# Patient Record
Sex: Female | Born: 2000 | Race: Black or African American | Hispanic: No | Marital: Single | Smoking: Never smoker
Health system: Southern US, Community
[De-identification: ages and names within clinical notes are randomized; demographics above are authoritative.]

---
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
## 2020-04-16 ENCOUNTER — Ambulatory Visit (INDEPENDENT_AMBULATORY_CARE_PROVIDER_SITE_OTHER): Payer: Medicaid Other

## 2020-04-16 ENCOUNTER — Other Ambulatory Visit: Payer: Self-pay

## 2020-04-16 ENCOUNTER — Ambulatory Visit (HOSPITAL_COMMUNITY)
Admission: EM | Admit: 2020-04-16 | Discharge: 2020-04-16 | Disposition: A | Discharge status: Home / Self Care | Payer: Medicaid Other | Attending: Family Medicine | Admitting: Family Medicine

## 2020-04-16 ENCOUNTER — Encounter (HOSPITAL_COMMUNITY): Payer: Self-pay

## 2020-04-16 DIAGNOSIS — W19XXXA Unspecified fall, initial encounter: Secondary | ICD-10-CM | POA: Diagnosis not present

## 2020-04-16 DIAGNOSIS — M25561 Pain in right knee: Secondary | ICD-10-CM

## 2020-04-16 DIAGNOSIS — M79671 Pain in right foot: Secondary | ICD-10-CM

## 2020-04-16 MED ORDER — IBUPROFEN 600 MG PO TABS: 600.0000 mg | ORAL_TABLET | Freq: Four times a day (QID) | ORAL | 0 refills | Status: DC | PRN

## 2020-04-16 NOTE — Discharge Instructions (Addendum)
Take ibuprofen for pain Limit activity for a few days Return as needed

## 2020-04-16 NOTE — ED Provider Notes (Signed)
[facility]    CSN: [id number] Arrival date & time: 04/16/20  0962      History   Chief Complaint Chief Complaint  Patient presents with  . Knee Pain  . Toe Pain    HPI Debra Higgins is a 19 y.o. female.   HPI  [place] forward landed on  Knees Right knee and right great tow are painful Pain worse with weight bearing States that she cannot comfortably bear weight, but is ambulating with minimal limp In good health and on no regular medications   No past medical history on file.  Patient Active Problem List   Diagnosis Date Noted  . 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 Abrasion of left ear canal 09/06/2014  . Abnormal uterine bleeding 03/01/2013    No past surgical history on file.  OB History   No obstetric history on file.      Home Medications    Prior to Admission medications   Medication Sig Start Date End Date Taking? Authorizing Provider  ibuprofen (ADVIL) 600 MG tablet Take 1 tablet (600 mg total) by mouth every 6 (six) hours as needed. 04/16/20   Raylene Everts, MD  neomycin-polymyxin-hydrocortisone (CORTISPORIN) 3.5-10000-1 OTIC suspension Place 4 drops into the right ear 2 times daily at 12 noon and 4 pm. 09/06/18   Robyn Haber, MD    Family History No family history on file.  Social History Social History   Tobacco Use  . Smoking status: Never Smoker  . Smokeless tobacco: Never Used  Substance Use Topics  . Alcohol use: Never  . Drug use: Yes    Frequency: 2.0 times per week    Types: Marijuana     Allergies   Patient has no known allergies.   Review of Systems Review of Systems  Musculoskeletal: Positive for arthralgias and gait problem.     Physical Exam Triage Vital Signs ED Triage Vitals  Enc Vitals Group     BP 04/16/20 1021 121/60     Pulse Rate 04/16/20 1021 68     Resp 04/16/20 1021 14     Temp 04/16/20 1021 98.4 F (36.9 C)     Temp Source 04/16/20  1021 Oral     SpO2 04/16/20 1021 100 %     Weight --      Height --      Head Circumference --      Peak Flow --      Pain Score 04/16/20 1018 2     Pain Loc --      Pain Edu? --      Excl. in [place]? --    No data found.  Updated Vital Signs BP 121/60 (BP Location: Right Arm)   Pulse 68   Temp 98.4 F (36.9 C) (Oral)   Resp 14   LMP 04/12/2020 (Exact Date)   SpO2 100%     Physical Exam Constitutional:      General: She is not in acute distress.    Appearance: She is well-developed and normal weight.  HENT:     Head: Normocephalic and atraumatic.     Mouth/Throat:     Comments: Mask is in place Eyes:     Conjunctiva/sclera: Conjunctivae normal.     Pupils: Pupils are equal, round, and reactive to light.  Cardiovascular:     Rate and Rhythm: Normal rate.  Pulmonary:     Effort: Pulmonary effort is normal. No respiratory  distress.  Musculoskeletal:        General: Normal range of motion.     Cervical back: Normal range of motion.     Comments: Right knee has some soft tissue swelling and tenderness over the tibial tuberosity.  No joint line tenderness.  No instability.  Right great toe has ecchymosis and swelling.  Tenderness over the middle phalanx and MCP.  Skin:    General: Skin is warm and dry.  Neurological:     Mental Status: She is alert.  Psychiatric:        Mood and Affect: Mood normal.        Behavior: Behavior normal.      UC Treatments / Results  Labs (all labs ordered are listed, but only abnormal results are displayed) Labs Reviewed - No data to display  EKG   Radiology DG Knee Complete 4 Views Right  Result Date: 04/16/2020 CLINICAL DATA:  Acute right knee pain after fall 4 days ago. EXAM: RIGHT KNEE - COMPLETE 4+ VIEW COMPARISON:  None. FINDINGS: No evidence of fracture, dislocation, or joint effusion. No evidence of arthropathy or other focal bone abnormality. Soft tissues are unremarkable. IMPRESSION: Negative. Electronically Signed   By:  Lupita Raider M.D.   On: 04/16/2020 12:08   DG Toe Great Right  Result Date: 04/16/2020 CLINICAL DATA:  Right great toe pain after fall 4 days ago. EXAM: RIGHT GREAT TOE COMPARISON:  None. FINDINGS: There is no evidence of fracture or dislocation. There is no evidence of arthropathy or other focal bone abnormality. Soft tissues are unremarkable. IMPRESSION: Negative. Electronically Signed   By: Lupita Raider M.D.   On: 04/16/2020 12:09    Procedures Procedures (including critical care time)  Medications Ordered in UC Medications - No data to display  Initial Impression / Assessment and Plan / UC Course  I have reviewed the triage vital signs and the nursing notes.  Pertinent labs & imaging results that were available during my care of the patient were reviewed by me and considered in my medical decision making (see chart for details).     Gust negative x-rays.  Conservative management.  Return as needed Final Clinical Impressions(s) / UC Diagnoses   Final diagnoses:  Acute pain of right knee  Fall, initial encounter     Discharge Instructions     Take ibuprofen for pain Limit activity for a few days Return as needed   ED Prescriptions    Medication Sig Dispense Auth. Provider   ibuprofen (ADVIL) 600 MG tablet Take 1 tablet (600 mg total) by mouth every 6 (six) hours as needed. 30 tablet Eustace Moore, MD     PDMP not reviewed this encounter.   Eustace Moore, MD 04/16/20 1251

## 2020-04-16 NOTE — ED Triage Notes (Signed)
C/o right knee pain and right big toe pain after fall on Saturday.

## 2020-10-29 ENCOUNTER — Ambulatory Visit: Payer: BC Managed Care – PPO | Admitting: Family Medicine

## 2020-11-05 NOTE — Progress Notes (Signed)
   Subjective:    Patient ID: Debra Higgins, female    DOB: 06-30-2001, 19 y.o.   MRN: [id number]  CC: Establish care  HPI:  Debra Higgins is a very pleasant 19 y.o. female who presents today to establish care.  Initial concerns:  Inverted nipples Notice when she was young. Is cosmetically dissatisfying. Denies breast pain, nipple drainage, or skin changes.   Passing out Occurred 3 times in the last month. First time was after smoking marijuana, second was when her uncle dropped an object on her foot, and third time was in the hairdresser's chair. Each time she gets really hot and nauseous. Unaware of how long she is "out". Has never occurred before and feels fine after she cools off.   Heavy periods Has had heavy periods since she started menstruating. Her periods are regular every month for a total of 6 days. She endorses that she changes her pads/tampons every hour. Concern for anemia   Past medical history: As above.  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 Smokes marijuana on the weekends. Same sex partner. Sexually active, does not use any protection.   ROS: pertinent noted in the HPI   Objective:  BP 102/72   Pulse 94   Ht 5\' 6"  (1.676 m)   Wt 116 lb 9.6 oz (52.9 kg)   LMP 10/23/2020   SpO2 99%   BMI 18.82 kg/m   Vitals and nursing note reviewed  General: NAD, pleasant, able to participate in exam Cardiac: RRR, S1 S2 present. normal heart sounds, no murmurs. Respiratory: CTAB, normal effort, No wheezes, rales or rhonchi Abdomen: Bowel sounds present, non-tender, non-distended, no hepatosplenomegaly Extremities: no edema or cyanosis. Skin: warm and dry, no rashes noted Neuro: alert, no obvious focal deficits Psych: Normal affect and mood  Assessment & Plan:   Heat syncope Acute over 1 month, occurring 3 times all after feelings of over heating.  Cardiac sounds unremarkable today and otherwise healthy without a history of passing out. Known marijuana smoker and incident surrounding this as well. Likely heat induced due to dilatation of blood vessels and lowering of Bp at the time of heat dysregulation. Discussed symptom diary, dressing in layer to easily take clothing off or going outside in cold air to cool off before feelings of passing out. If continues to occur consider cardiac work up such as starting with EKG, consider orthostatic BPs.   Menorrhagia with regular cycle Concern for anemia due to heavy bleeding endorse by changing pad/tampon every hour during cycle.  -f/u CBC w/ diff; consider iron supplementation if needed  Inversion of nipple Chronic. Will revisit if patient decides to breastfeed otherwise more of a cosmetic issue.   Routine screening for STI (sexually transmitted infection) Declined vaginal swab for GC/Ch & Wet mount - HCV Ab w Reflex to Quant PCR - HIV antibody (with reflex) - RPR  Need for vaccination - Meningococcal MCV4O(Menveo) - HPV 9-valent vaccine,Recombinat - Flu Vaccine QUAD 36+ mos IM  10/25/2020, DO [facility] PGY-2

## 2020-11-06 ENCOUNTER — Encounter: Payer: Self-pay | Admitting: Family Medicine

## 2020-11-06 ENCOUNTER — Ambulatory Visit (INDEPENDENT_AMBULATORY_CARE_PROVIDER_SITE_OTHER): Payer: 59 | Admitting: Family Medicine

## 2020-11-06 ENCOUNTER — Other Ambulatory Visit: Payer: Self-pay

## 2020-11-06 VITALS — BP 102/72 | HR 94 | Ht 66.0 in | Wt 116.6 lb

## 2020-11-06 DIAGNOSIS — N920 Excessive and frequent menstruation with regular cycle: Secondary | ICD-10-CM

## 2020-11-06 DIAGNOSIS — Z23 Encounter for immunization: Secondary | ICD-10-CM

## 2020-11-06 DIAGNOSIS — N6459 Other signs and symptoms in breast: Secondary | ICD-10-CM | POA: Diagnosis not present

## 2020-11-06 DIAGNOSIS — Z113 Encounter for screening for infections with a predominantly sexual mode of transmission: Secondary | ICD-10-CM | POA: Diagnosis not present

## 2020-11-06 DIAGNOSIS — T671XXA Heat syncope, initial encounter: Secondary | ICD-10-CM

## 2020-11-06 NOTE — Assessment & Plan Note (Signed)
Chronic. Will revisit if patient decides to breastfeed otherwise more of a cosmetic issue.

## 2020-11-06 NOTE — Assessment & Plan Note (Signed)
Acute over 1 month, occurring 3 times all after feelings of over heating. Cardiac sounds unremarkable today and otherwise healthy without a history of passing out. Known marijuana smoker and incident surrounding this as well. Likely heat induced due to dilatation of blood vessels and lowering of Bp at the time of heat dysregulation. Discussed symptom diary, dressing in layer to easily take clothing off or going outside in cold air to cool off before feelings of passing out. If continues to occur consider cardiac work up such as starting with EKG, consider orthostatic BPs.

## 2020-11-06 NOTE — Assessment & Plan Note (Signed)
Concern for anemia due to heavy bleeding endorse by changing pad/tampon every hour during cycle.  -f/u CBC w/ diff; consider iron supplementation if needed

## 2020-11-06 NOTE — Patient Instructions (Signed)
It was wonderful to see you today.  Today we talked about:  Inverted nipples which are more cosmetic and would only become an issue if you decided to breast-feed.  This can be revisited in the future.  Episodes of passing out when getting hot.  Please stay hydrated and dressed in layers to where you can take off close during the winter if you do become too warm.  Keep a symptom diary and please follow-up if this continues to occur.  We also discussed cutting back on marijuana use.  I will call you if any lab work is abnormal otherwise I will send a letter stating your results were normal.  Please call the clinic at [phone] if your symptoms worsen or you have any concerns. It was our pleasure to serve you.  Dr. Salvadore Dom

## 2020-11-07 ENCOUNTER — Encounter: Payer: Self-pay | Admitting: Family Medicine

## 2020-11-07 LAB — CBC WITH DIFFERENTIAL/PLATELET
Basophils Absolute: 0.1 10*3/uL (ref 0.0–0.2)
Basos: 1 %
EOS (ABSOLUTE): 0.1 10*3/uL (ref 0.0–0.4)
Eos: 2 %
Hematocrit: 32.2 % — ABNORMAL LOW (ref 34.0–46.6)
Hemoglobin: 11.1 g/dL (ref 11.1–15.9)
Immature Grans (Abs): 0 10*3/uL (ref 0.0–0.1)
Immature Granulocytes: 0 %
Lymphocytes Absolute: 2.2 10*3/uL (ref 0.7–3.1)
Lymphs: 37 %
MCH: 28.6 pg (ref 26.6–33.0)
MCHC: 34.5 g/dL (ref 31.5–35.7)
MCV: 83 fL (ref 79–97)
Monocytes Absolute: 0.5 10*3/uL (ref 0.1–0.9)
Monocytes: 9 %
Neutrophils Absolute: 3 10*3/uL (ref 1.4–7.0)
Neutrophils: 51 %
Platelets: 287 10*3/uL (ref 150–450)
RBC: 3.88 x10E6/uL (ref 3.77–5.28)
RDW: 14 % (ref 11.7–15.4)
WBC: 5.8 10*3/uL (ref 3.4–10.8)

## 2020-11-07 LAB — HCV INTERPRETATION

## 2020-11-07 LAB — HCV AB W REFLEX TO QUANT PCR: HCV Ab: 0.2 s/co ratio (ref 0.0–0.9)

## 2020-11-07 LAB — HIV ANTIBODY (ROUTINE TESTING W REFLEX): HIV Screen 4th Generation wRfx: NONREACTIVE

## 2020-11-07 LAB — RPR: RPR Ser Ql: NONREACTIVE

## 2020-12-06 IMAGING — DX DG TOE GREAT 2+V*R*
3 series · 3 of 3 positions shown · non-contrast
Comparison: None.

CLINICAL DATA: Right great toe pain after fall 4 days ago.

EXAM:
RIGHT GREAT TOE

[toe ap]
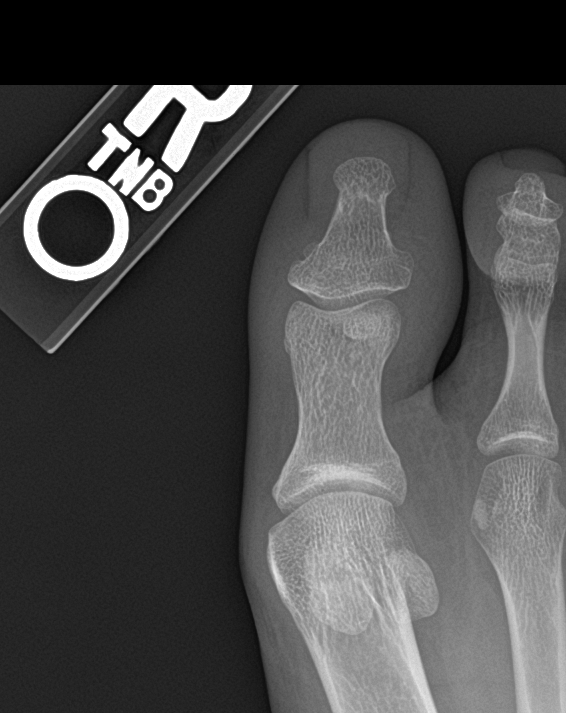

[toe obl]
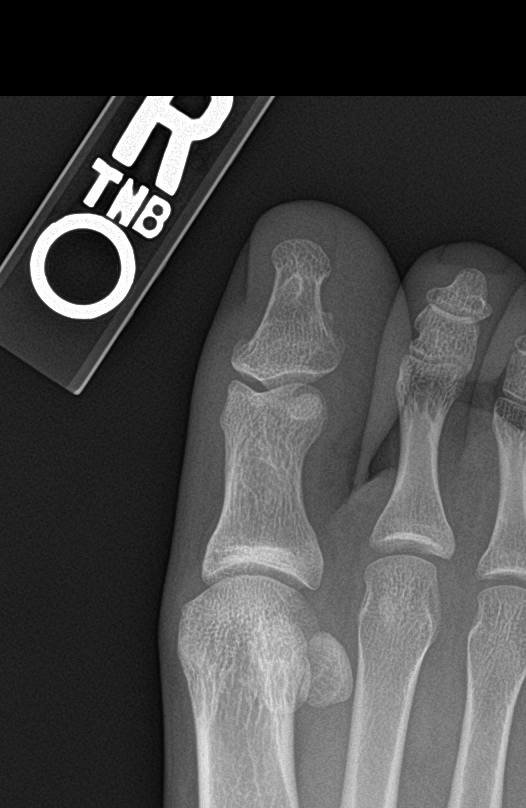

[toe lat]
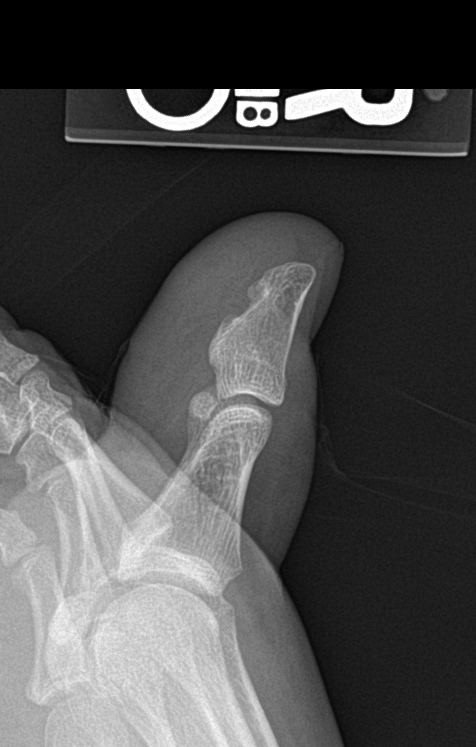

[3 of 3 positions shown; findings below may reference images not displayed]

FINDINGS: There is no evidence of fracture or dislocation. There is no
evidence of arthropathy or other focal bone abnormality. Soft
tissues are unremarkable.
IMPRESSION: Negative.

## 2022-07-31 ENCOUNTER — Ambulatory Visit (HOSPITAL_COMMUNITY)
Admission: EM | Admit: 2022-07-31 | Discharge: 2022-07-31 | Disposition: A | Discharge status: Home / Self Care | Payer: Medicaid Other | Attending: Nurse Practitioner | Admitting: Nurse Practitioner

## 2022-07-31 ENCOUNTER — Encounter (HOSPITAL_COMMUNITY): Payer: Self-pay | Admitting: Emergency Medicine

## 2022-07-31 ENCOUNTER — Ambulatory Visit (HOSPITAL_COMMUNITY): Payer: Medicaid Other

## 2022-07-31 DIAGNOSIS — U071 COVID-19: Secondary | ICD-10-CM | POA: Insufficient documentation

## 2022-07-31 DIAGNOSIS — R051 Acute cough: Secondary | ICD-10-CM

## 2022-07-31 LAB — POCT RAPID STREP A, ED / UC: Streptococcus, Group A Screen (Direct): NEGATIVE

## 2022-07-31 LAB — SARS CORONAVIRUS 2 BY RT PCR: SARS Coronavirus 2 by RT PCR: POSITIVE — AB

## 2022-07-31 LAB — POC INFLUENZA A AND B ANTIGEN (URGENT CARE ONLY)
INFLUENZA A ANTIGEN, POC: NEGATIVE
INFLUENZA B ANTIGEN, POC: NEGATIVE

## 2022-07-31 MED ORDER — BENZONATATE 100 MG PO CAPS: 100.0000 mg | ORAL_CAPSULE | Freq: Three times a day (TID) | ORAL | 0 refills | Status: AC

## 2022-07-31 NOTE — ED Provider Notes (Signed)
[facility]    CSN: [id number] Arrival date & time: 07/31/22  0847      History   Chief Complaint Chief Complaint  Patient presents with   Cough    HPI Debra Higgins is a 21 y.o. female.   HPI  She is complaining of cough and sore throat. Denies fever, head or nasal congestion, sneezing, runny nose,  ear pain or pressure, sore throat, new loss of smell or taste, shortness of breath, chest pain, nausea or diarrhea. She has not taken any OTC medication. She denies any recent exposures.   History reviewed. No pertinent past medical history.  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 No pertinent surgical history.  OB History   No obstetric history on file.      Home Medications    Prior to Admission medications   Medication Sig Start Date End Date Taking? Authorizing Provider  benzonatate (TESSALON) 100 MG capsule Take 1 capsule (100 mg total) by mouth every 8 (eight) hours. 07/31/22  Yes Barbette Merino, NP  ibuprofen (ADVIL) 600 MG tablet Take 1 tablet (600 mg total) by mouth every 6 (six) hours as needed. 04/16/20   Eustace Moore, MD  neomycin-polymyxin-hydrocortisone (CORTISPORIN) 3.5-10000-1 OTIC suspension Place 4 drops into the right ear 2 times daily at 12 noon and 4 pm. 09/06/18   Elvina Sidle, MD    Family History Family History  Problem Relation Age of Onset   Diabetes Father    Diabetes Maternal Grandmother    Diabetes Other    Cervical cancer Other    Breast cancer Other     Social History Social History   Tobacco Use   Smoking status: Never   Smokeless tobacco: Never  Substance Use Topics   Alcohol use: Never   Drug use: Yes    Frequency: 2.0 times per week    Types: Marijuana     Allergies   Patient has no known allergies.   Review of  Systems Review of Systems   Physical Exam Triage Vital Signs ED Triage Vitals  Enc Vitals Group     BP 07/31/22 0858 102/69     Pulse Rate 07/31/22 0858 85     Resp 07/31/22 0858 18     Temp 07/31/22 0858 98.9 F (37.2 C)     Temp Source 07/31/22 0858 Oral     SpO2 07/31/22 0858 98 %     Weight --      Height --      Head Circumference --      Peak Flow --      Pain Score 07/31/22 0856 0     Pain Loc --      Pain Edu? --      Excl. in GC? --    No data found.  Updated Vital Signs BP 102/69 (BP Location: Right Arm)   Pulse 85   Temp 98.9 F (37.2 C) (Oral)   Resp 18   SpO2 98%   Visual Acuity Right Eye Distance:   Left Eye Distance:   Bilateral Distance:    Right Eye Near:   Left Eye Near:    Bilateral Near:     Physical Exam Constitutional:      General: She is not in acute distress.  Appearance: She is normal weight.  HENT:     Head: Normocephalic and atraumatic.     Nose: Nose normal.     Mouth/Throat:     Mouth: Mucous membranes are moist.  Eyes:     Extraocular Movements: Extraocular movements intact.     Pupils: Pupils are equal, round, and reactive to light.  Cardiovascular:     Rate and Rhythm: Normal rate and regular rhythm.     Pulses: Normal pulses.  Pulmonary:     Effort: Pulmonary effort is normal.     Breath sounds: Normal breath sounds. No wheezing, rhonchi or rales.  Musculoskeletal:        General: Normal range of motion.     Cervical back: Normal range of motion.  Skin:    General: Skin is warm and dry.     Capillary Refill: Capillary refill takes less than 2 seconds.  Neurological:     General: No focal deficit present.     Mental Status: She is alert and oriented to person, place, and time.  Psychiatric:        Mood and Affect: Mood normal.        Behavior: Behavior normal.      UC Treatments / Results  Labs (all labs ordered are listed, but only abnormal results are displayed) Labs Reviewed  SARS CORONAVIRUS 2 BY  RT PCR  POCT RAPID STREP A, ED / UC  POC INFLUENZA A AND B ANTIGEN (URGENT CARE ONLY)    EKG   Radiology No results found.  Procedures Procedures (including critical care time)  Medications Ordered in UC Medications - No data to display  Initial Impression / Assessment and Plan / UC Course  I have reviewed the triage vital signs and the nursing notes.  Pertinent labs & imaging results that were available during my care of the patient were reviewed by me and considered in my medical decision making (see chart for details).     Cough COVID test pending  Final Clinical Impressions(s) / UC Diagnoses   Final diagnoses:  Acute cough     Discharge Instructions      Your COVID test in pending Influenza and Strep Test are negative Your cough can be soothed with a cough suppressant. You have been prescribed Benzonatate 100 mg three times a day  We encourage conservative treatment with symptom relief. We encourage you to use Tylenol alternating with Ibuprofen for your fever if not contraindicated. (Remember to use as directed do not exceed daily dosing recommendations) We also encourage salt water gargles for your sore throat. You should also consider throat lozenges and chloraseptic spray.         ED Prescriptions     Medication Sig Dispense Auth. Provider   benzonatate (TESSALON) 100 MG capsule Take 1 capsule (100 mg total) by mouth every 8 (eight) hours. 21 capsule Barbette Merino, NP      PDMP not reviewed this encounter.   Thad Ranger [place], [place] 07/31/22 [phone]

## 2022-07-31 NOTE — Discharge Instructions (Addendum)
Your COVID test in pending Influenza and Strep Test are negative Your cough can be soothed with a cough suppressant. You have been prescribed Benzonatate 100 mg three times a day  We encourage conservative treatment with symptom relief. We encourage you to use Tylenol alternating with Ibuprofen for your fever if not contraindicated. (Remember to use as directed do not exceed daily dosing recommendations) We also encourage salt water gargles for your sore throat. You should also consider throat lozenges and chloraseptic spray.

## 2022-07-31 NOTE — ED Triage Notes (Signed)
Pt reports a cough x 4/5 days. States she feels fine, however due to living with older people her aunt advised her to come get a covid test.

## 2022-08-04 ENCOUNTER — Emergency Department (HOSPITAL_BASED_OUTPATIENT_CLINIC_OR_DEPARTMENT_OTHER)
Admission: EM | Admit: 2022-08-04 | Discharge: 2022-08-04 | Disposition: A | Discharge status: Home / Self Care | Payer: Medicaid Other | Attending: Emergency Medicine | Admitting: Emergency Medicine

## 2022-08-04 ENCOUNTER — Other Ambulatory Visit: Payer: Self-pay

## 2022-08-04 ENCOUNTER — Encounter (HOSPITAL_BASED_OUTPATIENT_CLINIC_OR_DEPARTMENT_OTHER): Payer: Self-pay

## 2022-08-04 DIAGNOSIS — J029 Acute pharyngitis, unspecified: Secondary | ICD-10-CM | POA: Diagnosis not present

## 2022-08-04 DIAGNOSIS — J069 Acute upper respiratory infection, unspecified: Secondary | ICD-10-CM | POA: Insufficient documentation

## 2022-08-04 DIAGNOSIS — Z20822 Contact with and (suspected) exposure to covid-19: Secondary | ICD-10-CM | POA: Insufficient documentation

## 2022-08-04 DIAGNOSIS — R059 Cough, unspecified: Secondary | ICD-10-CM | POA: Diagnosis present

## 2022-08-04 LAB — SARS CORONAVIRUS 2 BY RT PCR: SARS Coronavirus 2 by RT PCR: NEGATIVE

## 2022-08-04 LAB — GROUP A STREP BY PCR: Group A Strep by PCR: NOT DETECTED

## 2022-08-04 NOTE — Discharge Instructions (Signed)
Recommend you follow-up in this department if your symptoms worsen over the next few days.  You likely have a viral upper respiratory infection which should run its course over the next several days.  If you develop worsening severe one-sided swelling, fever, difficulty swallowing, return to the emergency department for likely CT imaging as this could reflect a developing peritonsillar abscess.

## 2022-08-04 NOTE — ED Triage Notes (Signed)
Pt states that she has had a cough for the past week and sore throat for the past few days, denies fevers.

## 2022-08-04 NOTE — ED Provider Notes (Signed)
[facility] EMERGENCY DEPT Provider Note   CSN: [id number] Arrival date & time: 08/04/22  5053     History  Chief Complaint  Patient presents with   Sore Throat    Debra Higgins is a 21 y.o. female.   Sore Throat     21 year old female presenting to the emergency department with a cough for the past week and a sore throat for the past 12 hours.  The patient states that her symptoms of sore throat came on last night at midnight.  Debra Higgins has had a mild nonproductive cough for the past week.  Debra Higgins denies any sick contacts.  Debra Higgins denies any fevers or chills.  Debra Higgins endorses no difficulty swallowing or ranging her neck.  Debra Higgins endorses pain while swallowing.  No abdominal pain, nausea, vomiting.  Home Medications Prior to Admission medications   Medication Sig Start Date End Date Taking? Authorizing Provider  benzonatate (TESSALON) 100 MG capsule Take 1 capsule (100 mg total) by mouth every 8 (eight) hours. 07/31/22   Barbette Merino, NP  ibuprofen (ADVIL) 600 MG tablet Take 1 tablet (600 mg total) by mouth every 6 (six) hours as needed. 04/16/20   Eustace Moore, MD  neomycin-polymyxin-hydrocortisone (CORTISPORIN) 3.5-10000-1 OTIC suspension Place 4 drops into the right ear 2 times daily at 12 noon and 4 pm. 09/06/18   Elvina Sidle, MD      Allergies    Patient has no known allergies.    Review of Systems   Review of Systems  HENT:  Positive for sore throat.   All other systems reviewed and are negative.   Physical Exam Updated Vital Signs BP 107/72 (BP Location: Right Arm)   Pulse 79   Temp 98.2 F (36.8 C)   Resp 16   SpO2 100%  Physical Exam Vitals and nursing note reviewed.  Constitutional:      General: Debra Higgins is not in acute distress.    Appearance: Debra Higgins is well-developed.  HENT:     Head: Normocephalic and atraumatic.     Mouth/Throat:     Pharynx: Posterior oropharyngeal erythema present.     Tonsils: No tonsillar exudate or tonsillar abscesses. 0  on the right. 0 on the left.  Eyes:     Conjunctiva/sclera: Conjunctivae normal.  Cardiovascular:     Rate and Rhythm: Normal rate and regular rhythm.     Heart sounds: No murmur heard. Pulmonary:     Effort: Pulmonary effort is normal. No respiratory distress.     Breath sounds: Normal breath sounds.  Abdominal:     Palpations: Abdomen is soft.     Tenderness: There is no abdominal tenderness.  Musculoskeletal:        General: No swelling.     Cervical back: Neck supple.  Skin:    General: Skin is warm and dry.     Capillary Refill: Capillary refill takes less than 2 seconds.  Neurological:     Mental Status: Debra Higgins is alert.  Psychiatric:        Mood and Affect: Mood normal.     ED Results / Procedures / Treatments   Labs (all labs ordered are listed, but only abnormal results are displayed) Labs Reviewed  GROUP A STREP BY PCR  SARS CORONAVIRUS 2 BY RT PCR    EKG None  Radiology No results found.  Procedures Procedures    Medications Ordered in ED Medications - No data to display  ED Course/ Medical Decision Making/ A&P  Medical Decision Making    21 year old female presenting to the emergency department with a cough for the past week and a sore throat for the past 12 hours.  The patient states that her symptoms of sore throat came on last night at midnight.  Debra Higgins has had a mild nonproductive cough for the past week.  Debra Higgins denies any sick contacts.  Debra Higgins denies any fevers or chills.  Debra Higgins endorses no difficulty swallowing or ranging her neck.  Debra Higgins endorses pain while swallowing.  No abdominal pain, nausea, vomiting.   On arrival, the patient was vitally stable.  On my exam, the patient is well-appearing and well-hydrated.  The patient's lungs are clear to auscultation bilaterally. Additionally, the patient has a soft/non-tender abdomen, clear tympanic membranes, and no oropharyngeal exudates.  There are no signs of meningismus.  I see no  signs of an acute bacterial infection.  The patient's presentation is most consistent with a viral upper respiratory infection.  I have a low suspicion for pneumonia as the patient's cough has been non-productive and the patient is neither tachypneic nor hypoxic on room air.  Additionally, the patient is CTAB.  COVID-19 PCR testing was collected and resulted negative.  Strep PCR testing also resulted negative.  Symptoms are consistent with likely viral URI.  No evidence of peritonsillar abscess on exam.  Range of motion of the neck.  No asymmetric swelling noted on exam.  No evidence for Ludwig's or RPA.  I discussed symptomatic management, including hydration, motrin, and tylenol. The patient felt safe being discharged from the ED.  They agreed to followup with the PCP if needed.  I provided ED return precautions.  Final Clinical Impression(s) / ED Diagnoses Final diagnoses:  Sore throat  Upper respiratory tract infection, unspecified type    Rx / DC Orders ED Discharge Orders     None         Ernie Avena, MD 08/04/22 [phone]

## 2022-10-23 ENCOUNTER — Ambulatory Visit (HOSPITAL_COMMUNITY)
Admission: EM | Admit: 2022-10-23 | Discharge: 2022-10-23 | Disposition: A | Discharge status: Home / Self Care | Payer: Medicaid Other

## 2022-10-23 ENCOUNTER — Encounter (HOSPITAL_COMMUNITY): Payer: Self-pay

## 2022-10-23 DIAGNOSIS — Z8669 Personal history of other diseases of the nervous system and sense organs: Secondary | ICD-10-CM | POA: Diagnosis not present

## 2022-10-23 DIAGNOSIS — H6121 Impacted cerumen, right ear: Secondary | ICD-10-CM | POA: Diagnosis not present

## 2022-10-23 NOTE — ED Provider Notes (Signed)
[facility]    CSN: [id number] Arrival date & time: 10/23/22  1251      History   Chief Complaint Chief Complaint  Patient presents with   Otalgia    HPI Debra Higgins is a 21 y.o. female.  Presents with 3-day history of right ear discomfort and muffled hearing Reports history of wax buildup that usually feels like this Gets buildup about once yearly  Denies any fevers or drainage from the ear  History reviewed. No pertinent past medical history.  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 No pertinent surgical history.  OB History   No obstetric history on file.      Home Medications    Prior to Admission medications   Medication Sig Start Date End Date Taking? Authorizing Provider  benzonatate (TESSALON) 100 MG capsule Take 1 capsule (100 mg total) by mouth every 8 (eight) hours. 07/31/22   Barbette Merino, NP  ibuprofen (ADVIL) 600 MG tablet Take 1 tablet (600 mg total) by mouth every 6 (six) hours as needed. 04/16/20   Eustace Moore, MD  neomycin-polymyxin-hydrocortisone (CORTISPORIN) 3.5-10000-1 OTIC suspension Place 4 drops into the right ear 2 times daily at 12 noon and 4 pm. 09/06/18   Elvina Sidle, MD    Family History Family History  Problem Relation Age of Onset   Diabetes Father    Diabetes Maternal Grandmother    Diabetes Other    Cervical cancer Other    Breast cancer Other     Social History Social History   Tobacco Use   Smoking status: Never   Smokeless tobacco: Never  Substance Use Topics   Alcohol use: Never   Drug use: Yes    Frequency: 2.0 times per week    Types: Marijuana     Allergies   Patient has no known allergies.   Review of Systems Review of Systems  HENT:  Positive for ear pain.    Per HPI  Physical Exam Triage  Vital Signs ED Triage Vitals  Enc Vitals Group     BP 10/23/22 1410 109/69     Pulse Rate 10/23/22 1410 67     Resp 10/23/22 1410 12     Temp 10/23/22 1410 98 F (36.7 C)     Temp Source 10/23/22 1410 Oral     SpO2 10/23/22 1410 99 %     Weight --      Height --      Head Circumference --      Peak Flow --      Pain Score 10/23/22 1408 5     Pain Loc --      Pain Edu? --      Excl. in GC? --    No data found.  Updated Vital Signs BP 109/69 (BP Location: Left Arm)   Pulse 67   Temp 98 F (36.7 C) (Oral)   Resp 12   LMP 10/13/2022   SpO2 99%     Physical Exam Vitals and nursing note reviewed.  Constitutional:      General: She is not in acute distress. HENT:     Right Ear: External ear normal.     Left Ear: Tympanic membrane and ear canal normal.     Ears:     Comments: Right  ear canal mildly impacted with wax.  Appears to be mixture of hard and soft material. Left canal is clear Cardiovascular:     Rate and Rhythm: Normal rate and regular rhythm.  Pulmonary:     Effort: Pulmonary effort is normal.  Neurological:     Mental Status: She is alert and oriented to person, place, and time.      UC Treatments / Results  Labs (all labs ordered are listed, but only abnormal results are displayed) Labs Reviewed - No data to display  EKG   Radiology No results found.  Procedures Procedures (including critical care time)  Medications Ordered in UC Medications - No data to display  Initial Impression / Assessment and Plan / UC Course  I have reviewed the triage vital signs and the nursing notes.  Pertinent labs & imaging results that were available during my care of the patient were reviewed by me and considered in my medical decision making (see chart for details).  Right ear irrigation successful Still a small amount of soft wax but not impacted or occluding Patient reports symptoms improved  Discussed use of Debrox drops to prevent further wax  buildup Patient can return to UC if symptoms persist or return Agrees to plan  Final Clinical Impressions(s) / UC Diagnoses   Final diagnoses:  Excessive ear wax, right  History of impacted ear wax     Discharge Instructions      I recommend using Debrox drops twice daily for the next 4 days Then use twice weekly, as long as needed, to prevent wax buildup in the ear. They are available over the counter ($6 at [facility] and [employer])  You can return if symptoms persist or reoccur    ED Prescriptions   None    PDMP not reviewed this encounter.   Zayd Bonet, [place] 10/23/22 1531

## 2022-10-23 NOTE — ED Triage Notes (Signed)
Right ear pain x 3 days

## 2022-10-23 NOTE — Discharge Instructions (Addendum)
I recommend using Debrox drops twice daily for the next 4 days Then use twice weekly, as long as needed, to prevent wax buildup in the ear. They are available over the counter ($6 at [facility] and [employer])  You can return if symptoms persist or reoccur

## 2023-02-18 ENCOUNTER — Ambulatory Visit: Payer: Medicaid Other | Admitting: Family Medicine

## 2023-02-18 VITALS — BP 103/64 | HR 85 | Ht 66.0 in | Wt 118.6 lb

## 2023-02-18 DIAGNOSIS — Z23 Encounter for immunization: Secondary | ICD-10-CM

## 2023-02-18 DIAGNOSIS — R040 Epistaxis: Secondary | ICD-10-CM

## 2023-02-18 NOTE — Progress Notes (Signed)
    SUBJECTIVE:   CHIEF COMPLAINT / HPI:  Chief Complaint  Patient presents with   Epistaxis   Fatigue    Reports she had a nosebleed yesterday and this morning. Had packed it with tissues. Yesterday the nosebleed resolved with packing but recurred two more times (after sneezing, and after bending over). This AM resolved after a few minutes. Reports frequent nosebleeds usually before menses starts, nosebleeds probably occurring monthly. Reports heavy menses only on the first day, menses usually lasts 5 days Uses a humidifier sometimes  Also has been feeling fatigued only yesterday, not a problem before Wants to have her iron levels checked  PERTINENT  PMH / PSH:   Patient Care Team: Colletta Maryland, MD as PCP - General (Family Medicine)   OBJECTIVE:   BP 103/64   Pulse 85   Ht 5\' 6"  (1.676 m)   Wt 118 lb 9.6 oz (53.8 kg)   LMP 01/24/2023   SpO2 100%   BMI 19.14 kg/m   Physical Exam Constitutional:      General: She is not in acute distress.    Appearance: Normal appearance.  HENT:     Nose: Nose normal.  Cardiovascular:     Rate and Rhythm: Normal rate and regular rhythm.  Pulmonary:     Effort: Pulmonary effort is normal. No respiratory distress.     Breath sounds: Normal breath sounds.  Musculoskeletal:     Cervical back: Neck supple.  Neurological:     Mental Status: She is alert.         11/06/2020    2:28 PM  Depression screen PHQ 2/9  Decreased Interest 0  Down, Depressed, Hopeless 0  PHQ - 2 Score 0  Altered sleeping 0  Tired, decreased energy 0  Change in appetite 0  Feeling bad or failure about yourself  0  Trouble concentrating 0  Moving slowly or fidgety/restless 0  Suicidal thoughts 0  PHQ-9 Score 0     {Show previous vital signs (optional):23777}  Last CBC Lab Results  Component Value Date   WBC 5.8 11/06/2020   HGB 11.1 11/06/2020   HCT 32.2 (L) 11/06/2020   MCV 83 11/06/2020   MCH 28.6 11/06/2020   RDW 14.0 11/06/2020   PLT  287 11/06/2020      ASSESSMENT/PLAN:   1. Frequent nosebleeds Frequent nosebleeds occurring monthly.  Menses seem to be typical.  Most recent CBC reviewed unremarkable.  Will repeat labs today to ensure no anemia or thrombocytopenia.  Also check iron to rule out iron deficiency. - CBC - Ferritin  - advised to use humidifier more often   HCM - Tdap given - HPV vaccine given - flu vaccine given  Return in about 4 weeks (around 03/18/2023) for physical.   Zola Button, [place]

## 2023-02-18 NOTE — Patient Instructions (Addendum)
It was nice seeing you today!  We will check your blood counts and iron levels.  You can try using the humidifier more often to help with nosebleeds.  Make sure to come back for your physical whenever is convenient for you.  Stay well, Zola Button, MD [place] [phone]  --  Make sure to check out at the front desk before you leave today.  Please arrive at least 15 minutes prior to your scheduled appointments.  If you had blood work today, I will send you a MyChart message or a letter if results are normal. Otherwise, I will give you a call.  If you had a referral placed, they will call you to set up an appointment. Please give [place] a call if you don't hear back in the next 2 weeks.  If you need additional refills before your next appointment, please call your pharmacy first.

## 2023-02-19 LAB — CBC
Hematocrit: 29.1 % — ABNORMAL LOW (ref 34.0–46.6)
Hemoglobin: 9.1 g/dL — ABNORMAL LOW (ref 11.1–15.9)
MCH: 23.6 pg — ABNORMAL LOW (ref 26.6–33.0)
MCHC: 31.3 g/dL — ABNORMAL LOW (ref 31.5–35.7)
MCV: 76 fL — ABNORMAL LOW (ref 79–97)
Platelets: 203 10*3/uL (ref 150–450)
RBC: 3.85 x10E6/uL (ref 3.77–5.28)
RDW: 15.8 % — ABNORMAL HIGH (ref 11.7–15.4)
WBC: 6.6 10*3/uL (ref 3.4–10.8)

## 2023-02-19 LAB — FERRITIN: Ferritin: 5 ng/mL — ABNORMAL LOW (ref 15–150)

## 2023-02-19 MED ORDER — FERROUS SULFATE 325 (65 FE) MG PO TABS: 325.0000 mg | ORAL_TABLET | ORAL | 3 refills | Status: AC

## 2023-02-19 NOTE — Addendum Note (Signed)
Addended by: Zola Button D on: 02/19/2023 12:08 PM   Modules accepted: Orders

## 2023-03-11 ENCOUNTER — Encounter (HOSPITAL_COMMUNITY): Payer: Self-pay | Admitting: Emergency Medicine

## 2023-03-11 ENCOUNTER — Ambulatory Visit (HOSPITAL_COMMUNITY)
Admission: EM | Admit: 2023-03-11 | Discharge: 2023-03-11 | Disposition: A | Discharge status: Home / Self Care | Payer: Medicaid Other | Attending: Emergency Medicine | Admitting: Emergency Medicine

## 2023-03-11 DIAGNOSIS — M79602 Pain in left arm: Secondary | ICD-10-CM

## 2023-03-11 DIAGNOSIS — S66912A Strain of unspecified muscle, fascia and tendon at wrist and hand level, left hand, initial encounter: Secondary | ICD-10-CM

## 2023-03-11 MED ORDER — METHOCARBAMOL 500 MG PO TABS: 500.0000 mg | ORAL_TABLET | Freq: Two times a day (BID) | ORAL | 0 refills | Status: AC

## 2023-03-11 MED ORDER — IBUPROFEN 800 MG PO TABS: 800.0000 mg | ORAL_TABLET | Freq: Three times a day (TID) | ORAL | 0 refills | Status: AC
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
